# Patient Record
Sex: Female | Born: 1990 | Race: Black or African American | Hispanic: No | Marital: Single | Smoking: Never smoker
Health system: Southern US, Community
[De-identification: ages and names within clinical notes are randomized; demographics above are authoritative.]

---

## 2014-10-26 ENCOUNTER — Ambulatory Visit: Payer: Self-pay | Admitting: Family Medicine

## 2014-12-12 ENCOUNTER — Emergency Department (HOSPITAL_COMMUNITY): Payer: Medicaid Other

## 2014-12-12 ENCOUNTER — Emergency Department (HOSPITAL_COMMUNITY)
Admission: EM | Admit: 2014-12-12 | Discharge: 2014-12-12 | Disposition: A | Discharge status: Home / Self Care | Payer: Medicaid Other | Attending: Emergency Medicine | Admitting: Emergency Medicine

## 2014-12-12 ENCOUNTER — Encounter (HOSPITAL_COMMUNITY): Payer: Self-pay | Admitting: *Deleted

## 2014-12-12 DIAGNOSIS — R0789 Other chest pain: Secondary | ICD-10-CM | POA: Insufficient documentation

## 2014-12-12 DIAGNOSIS — R05 Cough: Secondary | ICD-10-CM | POA: Insufficient documentation

## 2014-12-12 DIAGNOSIS — R079 Chest pain, unspecified: Secondary | ICD-10-CM | POA: Diagnosis present

## 2014-12-12 NOTE — ED Notes (Signed)
PA at bedside.

## 2014-12-12 NOTE — ED Notes (Signed)
Pt reports sharp mid chest pains since last night, having non productive cough, pain increases when taking a deep breath.

## 2014-12-12 NOTE — ED Provider Notes (Signed)
CSN: [id number]     Arrival date & time 12/12/14  1146 History   First MD Initiated Contact with Patient 12/12/14 1158     Chief Complaint  Patient presents with  . Chest Pain    HPI   24 year old female presents with right anterior chest wall pain. SHE REPORTS SHE WAS CLEANING AROUND THE HOUSE WHEN SHE FELT A SHARP PAIN TO HER RIGHT ANTERIOR CHEST, WORSE WITH DEEP INSPIRATIONS. The pain is only present with palpation and respirations, no radiation of pain. Patient denies shortness of breath, diaphoresis, fever, chills, nausea or vomiting, distal extremity swelling, rhinorrhea, sore throat. She does endorse a nonproductive cough times one day.  Patient reports that she does not smoke, use recreational drugs, no recent history of immobilization, surgery, and is not sexually active. She otherwise healthy young female with no additional concerns in addition to the anterior chest pain.   History reviewed. No pertinent past medical history. History reviewed. No pertinent past surgical history. History reviewed. No pertinent family history. History  Substance Use Topics  . Smoking status: Not on file  . Smokeless tobacco: Not on file  . Alcohol Use: No   OB History    No data available     Review of Systems  All other systems reviewed and are negative.  Allergies  Review of patient's allergies indicates no known allergies.  Home Medications   Prior to Admission medications   Not on File   BP 108/64 mmHg  Pulse 78  Temp(Src) 98 F (36.7 C) (Oral)  Resp 18  Ht 5' (1.524 m)  Wt 135 lb (61.236 kg)  BMI 26.37 kg/m2  SpO2 100%  LMP 11/17/2014 Physical Exam  Constitutional: She is oriented to person, place, and time. She appears well-developed and well-nourished.  HENT:  Head: Normocephalic and atraumatic.  Eyes: Pupils are equal, round, and reactive to light.  Neck: Normal range of motion. Neck supple. No JVD present. No tracheal deviation present. No thyromegaly present.   Cardiovascular: Normal rate, regular rhythm, normal heart sounds and intact distal pulses.  Exam reveals no gallop and no friction rub.   No murmur heard. Pulmonary/Chest: Effort normal and breath sounds normal. No stridor. No respiratory distress. She has no decreased breath sounds. She has no wheezes. She has no rales. She exhibits no tenderness.  Tenderness to palpation of right anterior chest wall no signs of trauma.  Musculoskeletal: Normal range of motion.  Lymphadenopathy:    She has no cervical adenopathy.  Neurological: She is alert and oriented to person, place, and time. Coordination normal.  Skin: Skin is warm and dry.  Psychiatric: She has a normal mood and affect. Her behavior is normal. Judgment and thought content normal.  Nursing note and vitals reviewed.   ED Course  Procedures (including critical care time) Labs Review Labs Reviewed - No data to display  Imaging Review No results found.   EKG Interpretation   Date/Time:  Sunday December 12 2014 11:51:37 EDT Ventricular Rate:  76 PR Interval:  126 QRS Duration: 84 QT Interval:  374 QTC Calculation: 420 R Axis:   80 Text Interpretation:  Normal sinus rhythm with sinus arrhythmia Normal ECG  Sinus rhythm Artifact Abnormal ekg Confirmed by Gerhard MunchLOCKWOOD, ROBERT  MD  [phone]([id number]) on 12/12/2014 12:15:31 PM      MDM   Final diagnoses:  Chest wall pain    Labs: None  Imaging: DG chest 2 view  Consults: None  Therapeutics: None  Assessment: chest wall pain  Plan: She is presentation likely represents musculoskeletal/cartilage pain from her activities yesterday. Highly unlikely this is due to PE, pneumonia, or cardiac in nature due to patient's presentation and low risk profile. Patient is instructed to use ibuprofen as needed for the pain, avoid aggravating activities, and follow up with her primary care provider if symptoms worsen or do not improve. She understood and agreed to this plan. No concerns at time of  discharge.      Eyvonne Mechanic, PA-C 12/12/14 1428  Gerhard Munch, MD 12/12/14 [phone]

## 2014-12-12 NOTE — Discharge Instructions (Signed)
Chest Pain (Nonspecific) °It is often hard to give a specific diagnosis for the cause of chest pain. There is always a chance that your pain could be related to something serious, such as a heart attack or a blood clot in the lungs. You need to follow up with your health care provider for further evaluation. °CAUSES  °· Heartburn. °· Pneumonia or bronchitis. °· Anxiety or stress. °· Inflammation around your heart (pericarditis) or lung (pleuritis or pleurisy). °· A blood clot in the lung. °· A collapsed lung (pneumothorax). It can develop suddenly on its own (spontaneous pneumothorax) or from trauma to the chest. °· Shingles infection (herpes zoster virus). °The chest wall is composed of bones, muscles, and cartilage. Any of these can be the source of the pain. °· The bones can be bruised by injury. °· The muscles or cartilage can be strained by coughing or overwork. °· The cartilage can be affected by inflammation and become sore (costochondritis). °DIAGNOSIS  °Lab tests or other studies may be needed to find the cause of your pain. Your health care provider may have you take a test called an ambulatory electrocardiogram (ECG). An ECG records your heartbeat patterns over a 24-hour period. You may also have other tests, such as: °· Transthoracic echocardiogram (TTE). During echocardiography, sound waves are used to evaluate how blood flows through your heart. °· Transesophageal echocardiogram (TEE). °· Cardiac monitoring. This allows your health care provider to monitor your heart rate and rhythm in real time. °· Holter monitor. This is a portable device that records your heartbeat and can help diagnose heart arrhythmias. It allows your health care provider to track your heart activity for several days, if needed. °· Stress tests by exercise or by giving medicine that makes the heart beat faster. °TREATMENT  °· Treatment depends on what may be causing your chest pain. Treatment may include: °¨ Acid blockers for  heartburn. °¨ Anti-inflammatory medicine. °¨ Pain medicine for inflammatory conditions. °¨ Antibiotics if an infection is present. °· You may be advised to change lifestyle habits. This includes stopping smoking and avoiding alcohol, caffeine, and chocolate. °· You may be advised to keep your head raised (elevated) when sleeping. This reduces the chance of acid going backward from your stomach into your esophagus. °Most of the time, nonspecific chest pain will improve within 2-3 days with rest and mild pain medicine.  °HOME CARE INSTRUCTIONS  °· If antibiotics were prescribed, take them as directed. Finish them even if you start to feel better. °· For the next few days, avoid physical activities that bring on chest pain. Continue physical activities as directed. °· Do not use any tobacco products, including cigarettes, chewing tobacco, or electronic cigarettes. °· Avoid drinking alcohol. °· Only take medicine as directed by your health care provider. °· Follow your health care provider's suggestions for further testing if your chest pain does not go away. °· Keep any follow-up appointments you made. If you do not go to an appointment, you could develop lasting (chronic) problems with pain. If there is any problem keeping an appointment, call to reschedule. °SEEK MEDICAL CARE IF:  °· Your chest pain does not go away, even after treatment. °· You have a rash with blisters on your chest. °· You have a fever. °SEEK IMMEDIATE MEDICAL CARE IF:  °· You have increased chest pain or pain that spreads to your arm, neck, jaw, back, or abdomen. °· You have shortness of breath. °· You have an increasing cough, or you cough   up blood.  You have severe back or abdominal pain.  You feel nauseous or vomit.  You have severe weakness.  You faint.  You have chills. This is an emergency. Do not wait to see if the pain will go away. Get medical help at once. Call your local emergency services (911 in [place] Do not drive  yourself to the hospital. MAKE SURE YOU:   Understand these instructions.  Will watch your condition.  Will get help right away if you are not doing well or get worse. Document Released: 05/30/2005 Document Revised: 08/25/2013 Document Reviewed: 03/25/2008 [facility] Patient Information 2015 [employer] [place]. This information is not intended to replace advice given to you by your health care provider. Make sure you discuss any questions you have with your health care provider.  Please use ibuprofen 400 mg every 6 hours as needed for pain. If symptoms do not improve or worsen please follow-up with your primary care provider further evaluation and management. If worrisome symptoms present please return for further evaluation and management

## 2014-12-12 NOTE — ED Notes (Signed)
Patient transported to X-ray 

## 2015-05-02 ENCOUNTER — Emergency Department (INDEPENDENT_AMBULATORY_CARE_PROVIDER_SITE_OTHER)
Admission: EM | Admit: 2015-05-02 | Discharge: 2015-05-02 | Disposition: A | Discharge status: Home / Self Care | Payer: Medicaid Other | Source: Home / Self Care | Attending: Family Medicine | Admitting: Family Medicine

## 2015-05-02 ENCOUNTER — Encounter (HOSPITAL_COMMUNITY): Payer: Self-pay | Admitting: Emergency Medicine

## 2015-05-02 DIAGNOSIS — N926 Irregular menstruation, unspecified: Secondary | ICD-10-CM | POA: Diagnosis not present

## 2015-05-02 DIAGNOSIS — Z3009 Encounter for other general counseling and advice on contraception: Secondary | ICD-10-CM | POA: Diagnosis not present

## 2015-05-02 LAB — POCT URINALYSIS DIP (DEVICE)
BILIRUBIN URINE: NEGATIVE
Glucose, UA: NEGATIVE mg/dL
HGB URINE DIPSTICK: NEGATIVE
KETONES UR: NEGATIVE mg/dL
LEUKOCYTES UA: NEGATIVE
NITRITE: NEGATIVE
PROTEIN: NEGATIVE mg/dL
Urobilinogen, UA: 0.2 mg/dL (ref 0.0–1.0)
pH: 6 (ref 5.0–8.0)

## 2015-05-02 LAB — POCT PREGNANCY, URINE: PREG TEST UR: NEGATIVE

## 2015-05-02 NOTE — ED Provider Notes (Signed)
CSN: [id number]     Arrival date & time 05/02/15  1747 History   First MD Initiated Contact with Patient 05/02/15 1851     Chief Complaint  Patient presents with  . Vaginal Bleeding   (Consider location/radiation/quality/duration/timing/severity/associated sxs/prior Treatment) HPI    Vaginal bleeding: Patients menstrual cycles are typically every 28 days and last approximately 4 days. Patient states that she started vaginal bleeding 7 days early and bled for 5 days. She had 1-2 days without any bleeding but then started another 3 days of bleeding. She came in after she stopped bleeding. Patient is sexually active without any form of birth control. Patient is not trying to get pregnant. Patient denies any other vaginal discharge, vaginal irritation, dysuria, frequency, back pain, fevers, lower abdominal pain, back pain, nausea, vomiting, diarrhea, constipation.   History reviewed. No pertinent past medical history. History reviewed. No pertinent past surgical history. History reviewed. No pertinent family history. Social History  Substance Use Topics  . Smoking status: None  . Smokeless tobacco: None  . Alcohol Use: No   OB History    No data available     Review of Systems Per HPI with all other pertinent systems negative.   Allergies  Review of patient's allergies indicates no known allergies.  Home Medications   Prior to Admission medications   Medication Sig Start Date End Date Taking? Authorizing Provider  aspirin-acetaminophen-caffeine (EXCEDRIN MIGRAINE) [phone] MG per tablet Take 1 tablet by mouth every 6 (six) hours as needed for headache.    Historical Provider, MD  Echinacea-Goldenseal (IMMUNE HEALTH BLEND) CAPS Take 1 capsule by mouth daily.    Historical Provider, MD   Meds Ordered and Administered this Visit  Medications - No data to display  BP 113/53 mmHg  Pulse 64  Temp(Src) 98.3 F (36.8 C) (Oral)  Resp 16  SpO2 100%  LMP 05/01/2015 No data  found.   Physical Exam Physical Exam  Constitutional: oriented to person, place, and time. appears well-developed and well-nourished. No distress.  HENT:  Head: Normocephalic and atraumatic.  Eyes: EOMI. PERRL.  Neck: Normal range of motion.  Cardiovascular: RRR, no m/r/g, 2+ distal pulses,  Pulmonary/Chest: Effort normal and breath sounds normal. No respiratory distress.  Abdominal: Soft. Bowel sounds are normal. NonTTP, no distension.  Musculoskeletal: Normal range of motion. Non ttp, no effusion.  Neurological: alert and oriented to person, place, and time.  Skin: Skin is warm. No rash noted. non diaphoretic.  Psychiatric: normal mood and affect. behavior is normal. Judgment and thought content normal.   ED Course  Procedures (including critical care time)  Labs Review Labs Reviewed  POCT URINALYSIS DIP (DEVICE)  POCT PREGNANCY, URINE    Imaging Review No results found.   Visual Acuity Review  Right Eye Distance:   Left Eye Distance:   Bilateral Distance:    Right Eye Near:   Left Eye Near:    Bilateral Near:         MDM   1. Abnormal menstrual cycle   2. General counselling and advice on contraception    UA negative. Urine pregnancy negative. Patient likely with normal physiologic change in hormone levels causing abnormal uterine bleeding. No evidence of pregnancy at this time. Significant counseling given to patient regarding birth control and patient will seek follow-up with her primary care physician regarding further birth control options. Counseled patient that if this problem persists she may need to seek further workup such as testing appointment levels, dosing of Provera, or  endometrial testing or ultrasound. Patient will follow-up as needed.   Ozella Rocks, MD 05/02/15 [phone]

## 2015-05-02 NOTE — Discharge Instructions (Signed)
The cause of your symptoms is likely abnormal hormone levels. This is to be expected from time to time. Your pregnancy test was negative. Please call your primary care physician's office tomorrow to schedule a follow-up for sometime in the next 1-4 weeks to discuss birth control and further management if this problem persists. There is no sign of other serious underlying medical condition.

## 2015-05-02 NOTE — ED Notes (Signed)
C/o vaginal bleeding

## 2015-11-24 ENCOUNTER — Encounter (HOSPITAL_COMMUNITY): Payer: Self-pay | Admitting: Emergency Medicine

## 2015-11-24 ENCOUNTER — Emergency Department (HOSPITAL_COMMUNITY)
Admission: EM | Admit: 2015-11-24 | Discharge: 2015-11-24 | Disposition: A | Discharge status: Home / Self Care | Payer: Medicaid Other | Attending: Emergency Medicine | Admitting: Emergency Medicine

## 2015-11-24 ENCOUNTER — Emergency Department (HOSPITAL_COMMUNITY): Payer: Medicaid Other

## 2015-11-24 DIAGNOSIS — E860 Dehydration: Secondary | ICD-10-CM | POA: Diagnosis not present

## 2015-11-24 DIAGNOSIS — R000 Tachycardia, unspecified: Secondary | ICD-10-CM | POA: Insufficient documentation

## 2015-11-24 DIAGNOSIS — R69 Illness, unspecified: Secondary | ICD-10-CM

## 2015-11-24 DIAGNOSIS — J111 Influenza due to unidentified influenza virus with other respiratory manifestations: Secondary | ICD-10-CM | POA: Insufficient documentation

## 2015-11-24 DIAGNOSIS — R05 Cough: Secondary | ICD-10-CM | POA: Diagnosis present

## 2015-11-24 LAB — URINALYSIS, ROUTINE W REFLEX MICROSCOPIC
Bilirubin Urine: NEGATIVE
GLUCOSE, UA: NEGATIVE mg/dL
Ketones, ur: NEGATIVE mg/dL
LEUKOCYTES UA: NEGATIVE
Nitrite: NEGATIVE
PROTEIN: NEGATIVE mg/dL
Specific Gravity, Urine: 1.005 (ref 1.005–1.030)
pH: 6 (ref 5.0–8.0)

## 2015-11-24 LAB — COMPREHENSIVE METABOLIC PANEL
ALT: 20 U/L (ref 14–54)
AST: 22 U/L (ref 15–41)
Albumin: 4.3 g/dL (ref 3.5–5.0)
Alkaline Phosphatase: 58 U/L (ref 38–126)
Anion gap: 8 (ref 5–15)
BILIRUBIN TOTAL: 0.3 mg/dL (ref 0.3–1.2)
BUN: 9 mg/dL (ref 6–20)
CHLORIDE: 103 mmol/L (ref 101–111)
CO2: 26 mmol/L (ref 22–32)
Calcium: 9 mg/dL (ref 8.9–10.3)
Creatinine, Ser: 0.83 mg/dL (ref 0.44–1.00)
GFR calc Af Amer: >60 mL/min (ref >60)
GFR calc non Af Amer: >60 mL/min (ref >60)
Glucose, Bld: 87 mg/dL (ref 65–99)
POTASSIUM: 3.8 mmol/L (ref 3.5–5.1)
Sodium: 137 mmol/L (ref 135–145)
Total Protein: 8 g/dL (ref 6.5–8.1)

## 2015-11-24 LAB — CBC
HEMATOCRIT: 40.8 % (ref 36.0–46.0)
Hemoglobin: 13.2 g/dL (ref 12.0–15.0)
MCH: 28.6 pg (ref 26.0–34.0)
MCHC: 32.4 g/dL (ref 30.0–36.0)
MCV: 88.3 fL (ref 78.0–100.0)
PLATELETS: 263 10*3/uL (ref 150–400)
RBC: 4.62 MIL/uL (ref 3.87–5.11)
RDW: 12.5 % (ref 11.5–15.5)
WBC: 6.1 10*3/uL (ref 4.0–10.5)

## 2015-11-24 LAB — URINE MICROSCOPIC-ADD ON

## 2015-11-24 LAB — LIPASE, BLOOD: LIPASE: 31 U/L (ref 11–51)

## 2015-11-24 MED ORDER — ONDANSETRON 8 MG PO TBDP: 8.0000 mg | ORAL_TABLET | Freq: Three times a day (TID) | ORAL | Status: DC | PRN

## 2015-11-24 MED ORDER — ACETAMINOPHEN 325 MG PO TABS
650.0000 mg | ORAL_TABLET | Freq: Once | ORAL | Status: AC
  Administered 2015-11-24: 650 mg via ORAL
  Filled 2015-11-24: qty 2

## 2015-11-24 MED ORDER — SODIUM CHLORIDE 0.9 % IV BOLUS (SEPSIS)
1000.0000 mL | Freq: Once | INTRAVENOUS | Status: AC
  Administered 2015-11-24: 1000 mL via INTRAVENOUS

## 2015-11-24 MED ORDER — KETOROLAC TROMETHAMINE 30 MG/ML IJ SOLN
30.0000 mg | Freq: Once | INTRAMUSCULAR | Status: AC
Start: 2015-11-24 — End: 2015-11-24
  Administered 2015-11-24: 30 mg via INTRAVENOUS
  Filled 2015-11-24: qty 1

## 2015-11-24 MED ORDER — ONDANSETRON HCL 4 MG/2ML IJ SOLN
4.0000 mg | Freq: Once | INTRAMUSCULAR | Status: AC
  Administered 2015-11-24: 4 mg via INTRAVENOUS
  Filled 2015-11-24: qty 2

## 2015-11-24 MED ORDER — ONDANSETRON 8 MG PO TBDP: 8.0000 mg | ORAL_TABLET | Freq: Once | ORAL | Status: DC

## 2015-11-24 NOTE — Progress Notes (Signed)
Pt is covered by Medicaid family planning

## 2015-11-24 NOTE — ED Provider Notes (Signed)
CSN: [id number]     Arrival date & time 11/24/15  [id number] History   First MD Initiated Contact with Patient 11/24/15 1204     Chief Complaint  Patient presents with  . Cough  . Generalized Body Aches  . Sore Throat     HPI Patient presents to the emergency department with complaints of cough, body aches, sore throat and fever past several days. She reports no past medical history. She denies diarrhea. She reports mild decreased oral intake. She reports feeling generally weak. Symptoms are mild-to-moderate in severity. Mother worsens or improves her symptoms.   History reviewed. No pertinent past medical history. No past surgical history on file. No family history on file. Social History  Substance Use Topics  . Smoking status: None  . Smokeless tobacco: None  . Alcohol Use: No   OB History    No data available     Review of Systems  All other systems reviewed and are negative.     Allergies  Review of patient's allergies indicates no known allergies.  Home Medications   Prior to Admission medications   Medication Sig Start Date End Date Taking? Authorizing Provider  aspirin-acetaminophen-caffeine (EXCEDRIN MIGRAINE) [phone] MG per tablet Take 1 tablet by mouth every 6 (six) hours as needed for headache.   Yes Historical Provider, MD   BP 122/75 mmHg  Pulse 105  Temp(Src) 100.3 F (37.9 C) (Oral)  Resp 18  SpO2 100%  LMP 11/24/2015 Physical Exam  Constitutional: She is oriented to person, place, and time. She appears well-developed and well-nourished. No distress.  HENT:  Head: Normocephalic and atraumatic.  Dry mucous membranes  Eyes: EOM are normal.  Neck: Normal range of motion.  Cardiovascular: Regular rhythm and normal heart sounds.   Tachycardia  Pulmonary/Chest: Effort normal and breath sounds normal.  Abdominal: Soft. She exhibits no distension. There is no tenderness.  Musculoskeletal: Normal range of motion.  Neurological: She is alert and  oriented to person, place, and time.  Skin: Skin is warm and dry.  Psychiatric: She has a normal mood and affect. Judgment normal.  Nursing note and vitals reviewed.   ED Course  Procedures (including critical care time) Labs Review Labs Reviewed  URINALYSIS, ROUTINE W REFLEX MICROSCOPIC (NOT AT [facility]) - Abnormal; Notable for the following:    Hgb urine dipstick LARGE (*)    All other components within normal limits  URINE MICROSCOPIC-ADD ON - Abnormal; Notable for the following:    Squamous Epithelial / LPF 0-5 (*)    Bacteria, UA RARE (*)    All other components within normal limits  LIPASE, BLOOD  COMPREHENSIVE METABOLIC PANEL  CBC    Imaging Review Dg Chest 2 View  11/24/2015  CLINICAL DATA:  Cough, fever, headache for about 3 days EXAM: CHEST  2 VIEW COMPARISON:  None. FINDINGS: Cardiomediastinal silhouette is unremarkable. No acute infiltrate or pleural effusion. No pulmonary edema. Mild perihilar bronchitic changes. Bony thorax is unremarkable. IMPRESSION: No infiltrate or pulmonary edema. Mild perihilar bronchitic changes. Electronically Signed   By: Natasha MeadLiviu  Pop M.D.   On: 11/24/2015 12:43   I have personally reviewed and evaluated these images and lab results as part of my medical decision-making.   EKG Interpretation None      MDM   Final diagnoses:  None    Overall well-appearing. Feels much better after IV fluids. Nontoxic. Discharge home with influenza-like illness and dehydration. Home with antinausea medicine and instructions to continue oral hydration at home. Ibuprofen  and Tylenol for symptom control.    Azalia Bilis, MD 11/24/15 [phone]

## 2015-11-24 NOTE — ED Notes (Signed)
Pt c/o cough, body aches, sore throat, and fever x 3 days.  Pt states she has also been vomiting.

## 2015-11-29 ENCOUNTER — Emergency Department (HOSPITAL_COMMUNITY)
Admission: EM | Admit: 2015-11-29 | Discharge: 2015-11-29 | Disposition: A | Discharge status: Home / Self Care | Payer: Self-pay | Attending: Emergency Medicine | Admitting: Emergency Medicine

## 2015-11-29 ENCOUNTER — Encounter (HOSPITAL_COMMUNITY): Payer: Self-pay | Admitting: Emergency Medicine

## 2015-11-29 ENCOUNTER — Emergency Department (HOSPITAL_COMMUNITY): Payer: Self-pay

## 2015-11-29 DIAGNOSIS — R0981 Nasal congestion: Secondary | ICD-10-CM

## 2015-11-29 DIAGNOSIS — J3489 Other specified disorders of nose and nasal sinuses: Secondary | ICD-10-CM | POA: Insufficient documentation

## 2015-11-29 DIAGNOSIS — Z79899 Other long term (current) drug therapy: Secondary | ICD-10-CM | POA: Insufficient documentation

## 2015-11-29 MED ORDER — IPRATROPIUM BROMIDE 0.03 % NA SOLN
2.0000 | NASAL | Status: AC
  Administered 2015-11-29: 2 via NASAL
  Filled 2015-11-29: qty 30

## 2015-11-29 NOTE — Discharge Instructions (Signed)
You have been given Atrovent nasal spray to help with your nasal congestion, uses 3 times a day.  Make sure to drink plenty of fluids.  Continue taking Tylenol, ibuprofen, alternating basis for fevers or myalgias

## 2015-11-29 NOTE — ED Provider Notes (Signed)
CSN: [id number]     Arrival date & time 11/29/15  0052 History   First MD Initiated Contact with Patient 11/29/15 0335     Chief Complaint  Patient presents with  . Shortness of Breath     (Consider location/radiation/quality/duration/timing/severity/associated sxs/prior Treatment) HPI Comments: This is a 25 year old female who was diagnosed with influenza-like illness on March 23.  Presenting tonight with nasal congestion that she states will not respond to any of the over-the-counter medication.  She has taken.  She is finding it hard to sleep, and expresses it as "shortness of breath."  She is not having any coughing.  She is able to get a full long of air  Patient is a 25 y.o. female presenting with shortness of breath. The history is provided by the patient.  Shortness of Breath Severity:  Moderate Onset quality:  Gradual Timing:  Constant Progression:  Unchanged Chronicity:  New Relieved by:  Nothing Worsened by:  Nothing tried Associated symptoms: no ear pain     History reviewed. No pertinent past medical history. History reviewed. No pertinent past surgical history. No family history on file. Social History  Substance Use Topics  . Smoking status: Never Smoker   . Smokeless tobacco: None  . Alcohol Use: No   OB History    No data available     Review of Systems  HENT: Positive for congestion and rhinorrhea. Negative for dental problem, ear pain, postnasal drip and sinus pressure.   Respiratory: Positive for shortness of breath.       Allergies  Review of patient's allergies indicates no known allergies.  Home Medications   Prior to Admission medications   Medication Sig Start Date End Date Taking? Authorizing Provider  aspirin-acetaminophen-caffeine (EXCEDRIN MIGRAINE) [phone] MG per tablet Take 1 tablet by mouth every 6 (six) hours as needed for headache.   Yes Historical Provider, MD  DM-Phenylephrine-Acetaminophen (ALKA-SELTZER PLS SINUS & COUGH)  10-5-325 MG CAPS Take 1 capsule by mouth every 6 (six) hours.   Yes Historical Provider, MD  guaiFENesin (MUCINEX) 600 MG 12 hr tablet Take 600 mg by mouth 2 (two) times daily.   Yes Historical Provider, MD  ibuprofen (ADVIL,MOTRIN) 200 MG tablet Take 400 mg by mouth every 6 (six) hours as needed for moderate pain.   Yes Historical Provider, MD  Pseudoeph-Doxylamine-DM-APAP (NYQUIL MULTI-SYMPTOM PO) Take 30 mLs by mouth at bedtime.   Yes Historical Provider, MD  ondansetron (ZOFRAN ODT) 8 MG disintegrating tablet Take 1 tablet (8 mg total) by mouth every 8 (eight) hours as needed for nausea or vomiting. Patient not taking: Reported on 11/29/2015 11/24/15   Azalia BilisKevin Campos, MD   BP 103/77 mmHg  Pulse 77  Temp(Src) 97.8 F (36.6 C) (Oral)  Resp 20  SpO2 100%  LMP 11/24/2015 Physical Exam  Constitutional: She appears well-developed and well-nourished.  HENT:  Head: Normocephalic.  Nose: Rhinorrhea present. No sinus tenderness. Right sinus exhibits no maxillary sinus tenderness and no frontal sinus tenderness. Left sinus exhibits no maxillary sinus tenderness and no frontal sinus tenderness.  Mouth/Throat: Oropharynx is clear and moist.  Eyes: Pupils are equal, round, and reactive to light.  Neck: Normal range of motion.  Cardiovascular: Normal rate.   Pulmonary/Chest: Effort normal.  Musculoskeletal: Normal range of motion.  Neurological: She is alert.  Skin: Skin is warm.  Nursing note and vitals reviewed.   ED Course  Procedures (including critical care time) Labs Review Labs Reviewed - No data to display  Imaging Review Dg  Chest 2 View  11/29/2015  CLINICAL DATA:  Acute onset of shortness of breath. Productive cough. Initial encounter. EXAM: CHEST  2 VIEW COMPARISON:  Chest radiograph performed 11/24/2015 FINDINGS: The lungs are well-aerated and clear. There is no evidence of focal opacification, pleural effusion or pneumothorax. The heart is normal in size; the mediastinal contour is  within normal limits. No acute osseous abnormalities are seen. IMPRESSION: No acute cardiopulmonary process seen. Electronically Signed   By: Roanna Raider M.D.   On: 11/29/2015 02:05   I have personally reviewed and evaluated these images and lab results as part of my medical decision-making.   EKG Interpretation None     Initially, patient was sleeping when I entered the room, in no respiratory distress.  She's been given Atrovent nasal spray to help with her nasal congestion. MDM   Final diagnoses:  Nasal congestion         Earley Favor, NP 11/29/15 1610  Tomasita Crumble, MD 11/29/15 [phone]

## 2015-11-29 NOTE — ED Notes (Signed)
Pt c/o shortness of breath x 4 days, worse at night when she attempts to lay down, productive cough with yellow sputum.

## 2016-08-27 IMAGING — CR DG CHEST 2V
2 series · 2 of 2 positions shown · non-contrast
Comparison: Chest radiograph performed 11/24/2015

CLINICAL DATA: Acute onset of shortness of breath. Productive
cough. Initial encounter.

EXAM:
CHEST  2 VIEW

[w chest pa]
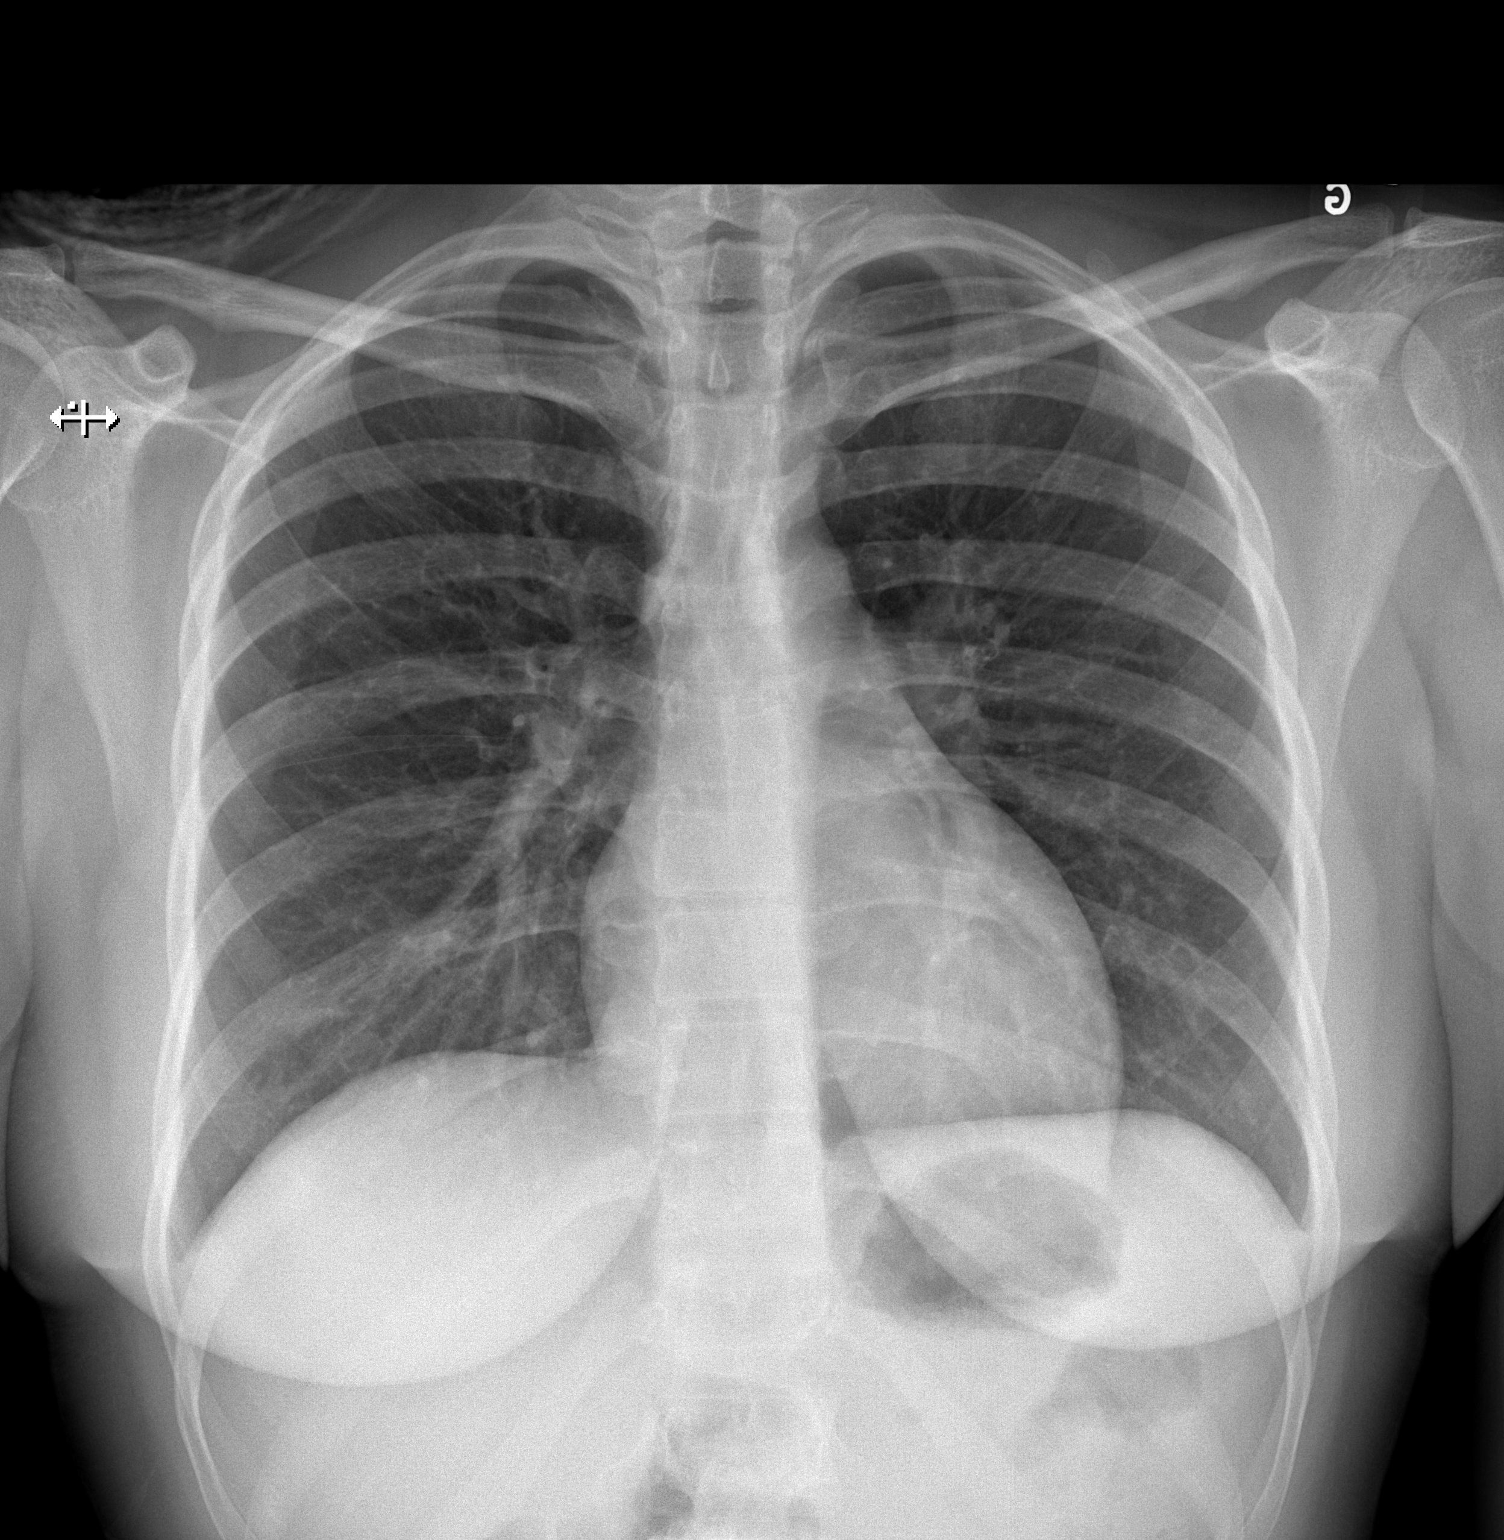

[w chest lat]
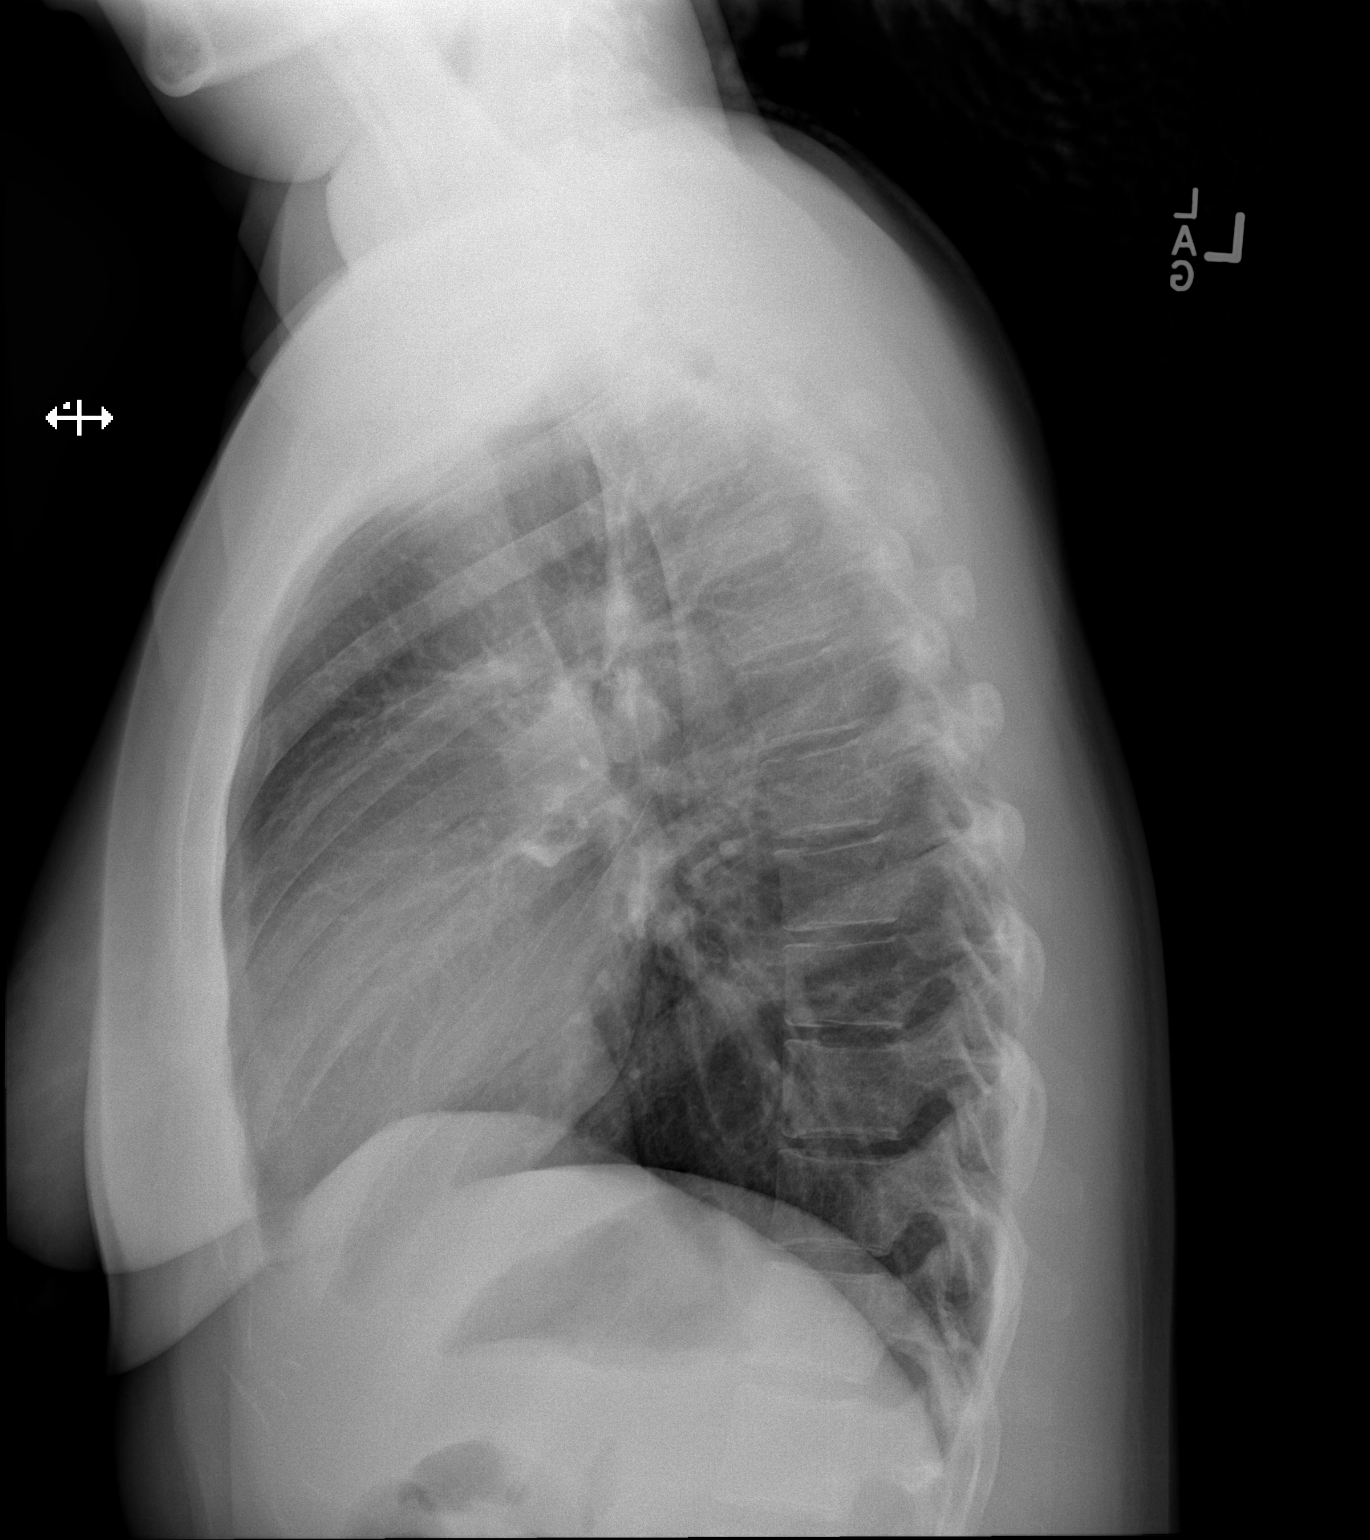

[2 of 2 positions shown; findings below may reference images not displayed]

FINDINGS: The lungs are well-aerated and clear. There is no evidence of focal
opacification, pleural effusion or pneumothorax.

The heart is normal in size; the mediastinal contour is within
normal limits. No acute osseous abnormalities are seen.
IMPRESSION: No acute cardiopulmonary process seen.

## 2017-09-03 NOTE — L&D Delivery Note (Addendum)
Patient: Kirsten Esparza MRN: [id number]  GBS status: Positive, IAP given (Amp)   Patient is a 27 y.o. now G5P5005 s/p NSVD at [redacted]w[redacted]d, who was admitted for SOL. AROM 0h 61m prior to delivery with clear fluid.    Delivery Note At 12:06 PM a viable female was delivered via Vaginal, Spontaneous (Presentation: Right OP).  APGAR: 9, 9; weight  .   Placenta status: Spontaneous, intact. Cord: 3 vessel, intact, longer than normally expected with the following complications: Nuchal cord x1.   Anesthesia: Epidural  Episiotomy: None Lacerations: 1st degree;Perineal Suture Repair: N/A Est. Blood Loss (mL): 208  Head delivered Right OA. Nuchal cord x1. Shoulder and body delivered in usual fashion. Infant with spontaneous cry, placed on mother's abdomen, dried and bulb suctioned. Cord clamped x 2 after 1-minute delay, and cut by mom. Cord blood drawn. Placenta delivered spontaneously with gentle cord traction. Fundus firm with massage and Pitocin. Perineum inspected and found to have a 1st degree perineal laceration, which was found to be hemostatic.  Mom to postpartum.  Baby to Couplet care / Skin to Skin.  Allayne Stack 06/23/2018, 12:27 PM  I confirm that I have verified the information documented in the resident's note and that I have also personally reperformed the physical exam and all medical decision making activities.  I was gloved and present for entire delivery SVD without incident No difficulty with shoulders Lacerations as listed above Repair not indicated  Aviva Signs, CNM

## 2018-01-28 DIAGNOSIS — Z3482 Encounter for supervision of other normal pregnancy, second trimester: Secondary | ICD-10-CM | POA: Diagnosis not present

## 2018-01-28 LAB — OB RESULTS CONSOLE GBS: GBS: POSITIVE

## 2018-03-10 DIAGNOSIS — O358XX0 Maternal care for other (suspected) fetal abnormality and damage, not applicable or unspecified: Secondary | ICD-10-CM | POA: Diagnosis not present

## 2018-03-17 DIAGNOSIS — O358XX0 Maternal care for other (suspected) fetal abnormality and damage, not applicable or unspecified: Secondary | ICD-10-CM | POA: Diagnosis not present

## 2018-03-17 DIAGNOSIS — O365930 Maternal care for other known or suspected poor fetal growth, third trimester, not applicable or unspecified: Secondary | ICD-10-CM | POA: Diagnosis not present

## 2018-05-22 DIAGNOSIS — Z1388 Encounter for screening for disorder due to exposure to contaminants: Secondary | ICD-10-CM | POA: Diagnosis not present

## 2018-05-22 DIAGNOSIS — Z3009 Encounter for other general counseling and advice on contraception: Secondary | ICD-10-CM | POA: Diagnosis not present

## 2018-05-22 DIAGNOSIS — Z23 Encounter for immunization: Secondary | ICD-10-CM | POA: Diagnosis not present

## 2018-05-22 DIAGNOSIS — F53 Postpartum depression: Secondary | ICD-10-CM | POA: Diagnosis not present

## 2018-05-22 DIAGNOSIS — N390 Urinary tract infection, site not specified: Secondary | ICD-10-CM | POA: Diagnosis not present

## 2018-05-22 DIAGNOSIS — Z3483 Encounter for supervision of other normal pregnancy, third trimester: Secondary | ICD-10-CM | POA: Diagnosis not present

## 2018-05-22 DIAGNOSIS — Z0389 Encounter for observation for other suspected diseases and conditions ruled out: Secondary | ICD-10-CM | POA: Diagnosis not present

## 2018-05-22 DIAGNOSIS — Z789 Other specified health status: Secondary | ICD-10-CM | POA: Diagnosis not present

## 2018-06-16 DIAGNOSIS — Z3483 Encounter for supervision of other normal pregnancy, third trimester: Secondary | ICD-10-CM | POA: Diagnosis not present

## 2018-06-23 ENCOUNTER — Inpatient Hospital Stay (HOSPITAL_COMMUNITY): Payer: Medicaid Other | Admitting: Anesthesiology

## 2018-06-23 ENCOUNTER — Other Ambulatory Visit: Payer: Self-pay

## 2018-06-23 ENCOUNTER — Inpatient Hospital Stay (HOSPITAL_COMMUNITY)
Admission: AD | Admit: 2018-06-23 | Discharge: 2018-06-25 | DRG: 807 | Disposition: A | Discharge status: Home / Self Care | Payer: Medicaid Other | Attending: Obstetrics and Gynecology | Admitting: Obstetrics and Gynecology

## 2018-06-23 ENCOUNTER — Encounter (HOSPITAL_COMMUNITY): Payer: Self-pay | Admitting: *Deleted

## 2018-06-23 DIAGNOSIS — Z3A38 38 weeks gestation of pregnancy: Secondary | ICD-10-CM | POA: Diagnosis not present

## 2018-06-23 DIAGNOSIS — O99824 Streptococcus B carrier state complicating childbirth: Secondary | ICD-10-CM | POA: Diagnosis present

## 2018-06-23 DIAGNOSIS — Z3483 Encounter for supervision of other normal pregnancy, third trimester: Secondary | ICD-10-CM | POA: Diagnosis not present

## 2018-06-23 LAB — ABO/RH: ABO/RH(D): O NEG

## 2018-06-23 LAB — CBC
HCT: 40 % (ref 36.0–46.0)
HEMOGLOBIN: 13.8 g/dL (ref 12.0–15.0)
MCH: 29.1 pg (ref 26.0–34.0)
MCHC: 34.5 g/dL (ref 30.0–36.0)
MCV: 84.2 fL (ref 80.0–100.0)
NRBC: 0 % (ref 0.0–0.2)
PLATELETS: 182 10*3/uL (ref 150–400)
RBC: 4.75 MIL/uL (ref 3.87–5.11)
RDW: 13.5 % (ref 11.5–15.5)
WBC: 10 10*3/uL (ref 4.0–10.5)

## 2018-06-23 LAB — TYPE AND SCREEN
ABO/RH(D): O NEG
ANTIBODY SCREEN: NEGATIVE
WEAK D: POSITIVE

## 2018-06-23 MED ORDER — OXYTOCIN 40 UNITS IN LACTATED RINGERS INFUSION - SIMPLE MED: 2.5000 [IU]/h | INTRAVENOUS | Status: DC

## 2018-06-23 MED ORDER — PHENYLEPHRINE 40 MCG/ML (10ML) SYRINGE FOR IV PUSH (FOR BLOOD PRESSURE SUPPORT)
80.0000 ug | PREFILLED_SYRINGE | INTRAVENOUS | Status: DC | PRN
  Filled 2018-06-23: qty 5
  Filled 2018-06-23: qty 10

## 2018-06-23 MED ORDER — ERYTHROMYCIN 5 MG/GM OP OINT
TOPICAL_OINTMENT | OPHTHALMIC | Status: AC
  Filled 2018-06-23: qty 1

## 2018-06-23 MED ORDER — WITCH HAZEL-GLYCERIN EX PADS: 1.0000 "application " | MEDICATED_PAD | CUTANEOUS | Status: DC | PRN

## 2018-06-23 MED ORDER — SODIUM CHLORIDE 0.9% FLUSH: 3.0000 mL | Freq: Two times a day (BID) | INTRAVENOUS | Status: DC

## 2018-06-23 MED ORDER — SOD CITRATE-CITRIC ACID 500-334 MG/5ML PO SOLN: 30.0000 mL | ORAL | Status: DC | PRN

## 2018-06-23 MED ORDER — PRENATAL MULTIVITAMIN CH
1.0000 | ORAL_TABLET | Freq: Every day | ORAL | Status: DC
  Administered 2018-06-24 – 2018-06-25 (×2): 1 via ORAL
  Filled 2018-06-23 (×2): qty 1

## 2018-06-23 MED ORDER — EPHEDRINE 5 MG/ML INJ
10.0000 mg | INTRAVENOUS | Status: DC | PRN
  Filled 2018-06-23: qty 2

## 2018-06-23 MED ORDER — LIDOCAINE HCL (PF) 1 % IJ SOLN
30.0000 mL | INTRAMUSCULAR | Status: DC | PRN
  Filled 2018-06-23: qty 30

## 2018-06-23 MED ORDER — OXYTOCIN BOLUS FROM INFUSION
500.0000 mL | Freq: Once | INTRAVENOUS | Status: AC
  Administered 2018-06-23: 500 mL via INTRAVENOUS

## 2018-06-23 MED ORDER — ACETAMINOPHEN 325 MG PO TABS
650.0000 mg | ORAL_TABLET | ORAL | Status: DC | PRN
  Administered 2018-06-23: 650 mg via ORAL
  Filled 2018-06-23: qty 2

## 2018-06-23 MED ORDER — PHENYLEPHRINE 40 MCG/ML (10ML) SYRINGE FOR IV PUSH (FOR BLOOD PRESSURE SUPPORT)
80.0000 ug | PREFILLED_SYRINGE | INTRAVENOUS | Status: DC | PRN
  Filled 2018-06-23: qty 5

## 2018-06-23 MED ORDER — OXYCODONE-ACETAMINOPHEN 5-325 MG PO TABS: 2.0000 | ORAL_TABLET | ORAL | Status: DC | PRN

## 2018-06-23 MED ORDER — OXYTOCIN 40 UNITS IN LACTATED RINGERS INFUSION - SIMPLE MED
INTRAVENOUS | Status: AC
  Administered 2018-06-23: 500 mL via INTRAVENOUS
  Filled 2018-06-23: qty 1000

## 2018-06-23 MED ORDER — ONDANSETRON HCL 4 MG/2ML IJ SOLN: 4.0000 mg | INTRAMUSCULAR | Status: DC | PRN

## 2018-06-23 MED ORDER — ACETAMINOPHEN 325 MG PO TABS: 650.0000 mg | ORAL_TABLET | ORAL | Status: DC | PRN

## 2018-06-23 MED ORDER — METHYLERGONOVINE MALEATE 0.2 MG PO TABS
0.2000 mg | ORAL_TABLET | ORAL | Status: AC | PRN
  Administered 2018-06-23 – 2018-06-24 (×5): 0.2 mg via ORAL
  Filled 2018-06-23 (×5): qty 1

## 2018-06-23 MED ORDER — LIDOCAINE HCL (PF) 1 % IJ SOLN
INTRAMUSCULAR | Status: DC | PRN
  Administered 2018-06-23: 10 mL via EPIDURAL

## 2018-06-23 MED ORDER — METHYLERGONOVINE MALEATE 0.2 MG/ML IJ SOLN
INTRAMUSCULAR | Status: AC
  Administered 2018-06-23: 0.2 mg
  Filled 2018-06-23: qty 1

## 2018-06-23 MED ORDER — TETANUS-DIPHTH-ACELL PERTUSSIS 5-2.5-18.5 LF-MCG/0.5 IM SUSP: 0.5000 mL | Freq: Once | INTRAMUSCULAR | Status: DC

## 2018-06-23 MED ORDER — SENNOSIDES-DOCUSATE SODIUM 8.6-50 MG PO TABS
2.0000 | ORAL_TABLET | ORAL | Status: DC
  Administered 2018-06-23 – 2018-06-24 (×2): 2 via ORAL
  Filled 2018-06-23 (×2): qty 2

## 2018-06-23 MED ORDER — DIPHENHYDRAMINE HCL 25 MG PO CAPS: 25.0000 mg | ORAL_CAPSULE | Freq: Four times a day (QID) | ORAL | Status: DC | PRN

## 2018-06-23 MED ORDER — MEASLES, MUMPS & RUBELLA VAC ~~LOC~~ INJ
0.5000 mL | INJECTION | Freq: Once | SUBCUTANEOUS | Status: DC
  Filled 2018-06-23: qty 0.5

## 2018-06-23 MED ORDER — SODIUM CHLORIDE 0.9 % IV SOLN
2.0000 g | Freq: Once | INTRAVENOUS | Status: AC
  Administered 2018-06-23: 2 g via INTRAVENOUS
  Filled 2018-06-23: qty 2

## 2018-06-23 MED ORDER — OXYCODONE-ACETAMINOPHEN 5-325 MG PO TABS: 1.0000 | ORAL_TABLET | ORAL | Status: DC | PRN

## 2018-06-23 MED ORDER — ZOLPIDEM TARTRATE 5 MG PO TABS: 5.0000 mg | ORAL_TABLET | Freq: Every evening | ORAL | Status: DC | PRN

## 2018-06-23 MED ORDER — SODIUM CHLORIDE 0.9 % IV SOLN: 250.0000 mL | INTRAVENOUS | Status: DC | PRN

## 2018-06-23 MED ORDER — IBUPROFEN 600 MG PO TABS
600.0000 mg | ORAL_TABLET | Freq: Four times a day (QID) | ORAL | Status: DC
  Administered 2018-06-23 – 2018-06-25 (×8): 600 mg via ORAL
  Filled 2018-06-23 (×8): qty 1

## 2018-06-23 MED ORDER — FENTANYL 2.5 MCG/ML BUPIVACAINE 1/10 % EPIDURAL INFUSION (WH - ANES)
14.0000 mL/h | INTRAMUSCULAR | Status: DC | PRN
  Administered 2018-06-23: 14 mL/h via EPIDURAL
  Filled 2018-06-23: qty 100

## 2018-06-23 MED ORDER — OXYTOCIN 10 UNIT/ML IJ SOLN
INTRAMUSCULAR | Status: AC
  Filled 2018-06-23: qty 1

## 2018-06-23 MED ORDER — FLEET ENEMA 7-19 GM/118ML RE ENEM: 1.0000 | ENEMA | RECTAL | Status: DC | PRN

## 2018-06-23 MED ORDER — ONDANSETRON HCL 4 MG PO TABS: 4.0000 mg | ORAL_TABLET | ORAL | Status: DC | PRN

## 2018-06-23 MED ORDER — LIDOCAINE HCL (PF) 1 % IJ SOLN
INTRAMUSCULAR | Status: AC
  Filled 2018-06-23: qty 30

## 2018-06-23 MED ORDER — ONDANSETRON HCL 4 MG/2ML IJ SOLN: 4.0000 mg | Freq: Four times a day (QID) | INTRAMUSCULAR | Status: DC | PRN

## 2018-06-23 MED ORDER — BENZOCAINE-MENTHOL 20-0.5 % EX AERO: 1.0000 "application " | INHALATION_SPRAY | CUTANEOUS | Status: DC | PRN

## 2018-06-23 MED ORDER — DIPHENHYDRAMINE HCL 50 MG/ML IJ SOLN: 12.5000 mg | INTRAMUSCULAR | Status: DC | PRN

## 2018-06-23 MED ORDER — SODIUM CHLORIDE 0.9% FLUSH: 3.0000 mL | INTRAVENOUS | Status: DC | PRN

## 2018-06-23 MED ORDER — LACTATED RINGERS IV SOLN
500.0000 mL | Freq: Once | INTRAVENOUS | Status: AC
  Administered 2018-06-23: 500 mL via INTRAVENOUS

## 2018-06-23 MED ORDER — LACTATED RINGERS IV SOLN
INTRAVENOUS | Status: DC
  Administered 2018-06-23: 10:00:00 via INTRAVENOUS

## 2018-06-23 MED ORDER — SIMETHICONE 80 MG PO CHEW: 80.0000 mg | CHEWABLE_TABLET | ORAL | Status: DC | PRN

## 2018-06-23 MED ORDER — DIBUCAINE 1 % RE OINT: 1.0000 "application " | TOPICAL_OINTMENT | RECTAL | Status: DC | PRN

## 2018-06-23 MED ORDER — LACTATED RINGERS IV SOLN: 500.0000 mL | INTRAVENOUS | Status: DC | PRN

## 2018-06-23 MED ORDER — COCONUT OIL OIL
1.0000 "application " | TOPICAL_OIL | Status: DC | PRN
  Filled 2018-06-23: qty 120

## 2018-06-23 NOTE — Anesthesia Postprocedure Evaluation (Signed)
Anesthesia Post Note  Patient: Kirsten Esparza  Procedure(s) Performed: AN AD HOC LABOR EPIDURAL     Patient location during evaluation: Mother Baby Anesthesia Type: Epidural Level of consciousness: awake and alert and oriented Pain management: satisfactory to patient Vital Signs Assessment: post-procedure vital signs reviewed and stable Respiratory status: respiratory function stable Cardiovascular status: stable Postop Assessment: no headache, no backache, epidural receding, patient able to bend at knees, no signs of nausea or vomiting and adequate PO intake Anesthetic complications: no    Last Vitals:  Vitals:   06/23/18 1424 06/23/18 1525  BP: 115/73 115/68  Pulse: 71 88  Resp: 16 15  Temp: 37.2 C 36.8 C  SpO2:  99%    Last Pain:  Vitals:   06/23/18 1525  TempSrc: Oral  PainSc: 0-No pain   Pain Goal: Patients Stated Pain Goal: 0 (06/23/18 0932)               Karleen Dolphin

## 2018-06-23 NOTE — Anesthesia Preprocedure Evaluation (Addendum)

## 2018-06-23 NOTE — MAU Note (Signed)
Pt C/O uc's since last night, have become very strong this a.m.  Denies bleeding or LOF.

## 2018-06-23 NOTE — Lactation Note (Signed)
This note was copied from a baby's chart. Lactation Consultation Note  Patient Name: Kirsten Esparza RUEAV'W Date: 06/23/2018 Reason for consult: Initial assessment   P5, Baby 4 hours old.  Mother breastfed her last two children - 3rd child 1 year, 4th child 6 mos. She states she has hand expressed drops and feels this baby is latching well.   Kandis Mannan recently breastfed for 20 min. Mother denies questions or concerns. Discussed basics. Mom encouraged to feed baby 8-12 times/24 hours and with feeding cues.  Mom made aware of O/P services, breastfeeding support groups, community resources, and our phone # for post-discharge questions.     Maternal Data Has patient been taught Hand Expression?: Yes Does the patient have breastfeeding experience prior to this delivery?: Yes  Feeding Feeding Type: Breast Fed  LATCH Score Latch: Grasps breast easily, tongue down, lips flanged, rhythmical sucking.  Audible Swallowing: A few with stimulation  Type of Nipple: Everted at rest and after stimulation  Comfort (Breast/Nipple): Soft / non-tender  Hold (Positioning): No assistance needed to correctly position infant at breast.  LATCH Score: 9  Interventions Interventions: Breast feeding basics reviewed  Lactation Tools Discussed/Used WIC Program: Yes   Consult Status Consult Status: Follow-up Date: 06/24/18 Follow-up type: In-patient    Dahlia Byes [facility] 06/23/2018, 4:10 PM

## 2018-06-23 NOTE — Progress Notes (Signed)
LABOR PROGRESS NOTE  Kirsten Esparza is a 27 y.o. [id number] at [redacted]w[redacted]d  admitted in SOL.   Subjective: Doing well, had epidural placed.   Objective: BP 110/78   Pulse (!) 111   Temp 98 F (36.7 C) (Oral)   Resp 16   Ht 5' (1.524 m)   Wt 73.1 kg   SpO2 100%   BMI 31.47 kg/m  or  Vitals:   06/23/18 1100 06/23/18 1105 06/23/18 1110 06/23/18 1115  BP: 115/67 111/83 111/71 110/78  Pulse: 74 76 77 (!) 111  Resp:    16  Temp:      TempSrc:      SpO2: 99% 100% 100%   Weight:      Height:         Dilation: 7.5 Effacement (%): 100 Cervical Position: Anterior Station: 0 Presentation: Vertex Exam by:: Lorn Junes, RN FHT: baseline rate 120's, moderate varibility, +acel, variable decels Toco: every few min   Labs: Lab Results  Component Value Date   WBC 10.0 06/23/2018   HGB 13.8 06/23/2018   HCT 40.0 06/23/2018   MCV 84.2 06/23/2018   PLT 182 06/23/2018    Patient Active Problem List   Diagnosis Date Noted  . Normal labor 06/23/2018    Assessment / Plan: 27 y.o. [id number] at [redacted]w[redacted]d here for SOL. GBS positive, Amp.   Labor: Expectant. AROM @ 1130, placed IUPC and Fetal electrode, start amnioinfusion. Likely deliver soon.  Fetal Wellbeing:  Cat 2 with variables- position changes and amnioinfusion, good return to baseline  Pain Control: Epidural  Anticipated MOD:  NSVD   Leticia Penna, D.O. Family Medicine PGY-1  06/23/2018, 11:55 AM

## 2018-06-23 NOTE — H&P (Signed)
Kirsten Esparza is a 27 y.o. female presenting for contractions which started last night.  Got stronger this morning.  [facility].  Prior deliveries were uncomplicated.  Patient Active Problem List   Diagnosis Date Noted  . Normal labor 06/23/2018   .  RN Note: Pt C/O uc's since last night, have become very strong this a.m.  Denies bleeding or LOF.  OB History    Gravida  4   Para  3   Term  3   Preterm      AB      Living  3     SAB      TAB      Ectopic      Multiple      Live Births             History reviewed. No pertinent past medical history. History reviewed. No pertinent surgical history. Family History: family history is not on file. Social History:  reports that she has never smoked. She has never used smokeless tobacco. She reports that she does not drink alcohol or use drugs.     Maternal Diabetes: No Genetic Screening: Normal Maternal Ultrasounds/Referrals: Normal Fetal Ultrasounds or other Referrals:  None Maternal Substance Abuse:  No Significant Maternal Medications:  None Significant Maternal Lab Results:  Lab values include: Group B Strep positive Other Comments:  None  Review of Systems  Constitutional: Negative for chills and fever.  Gastrointestinal: Positive for abdominal pain. Negative for nausea and vomiting.  Musculoskeletal: Positive for back pain.  Neurological: Negative for focal weakness and headaches.   Maternal Medical History:  Reason for admission: Contractions.  Nausea.  Contractions: Onset was 6-12 hours ago.   Frequency: regular.   Perceived severity is strong.    Fetal activity: Perceived fetal activity is normal.   Last perceived fetal movement was within the past hour.    Prenatal complications: No bleeding, PIH or pre-eclampsia.   Prenatal Complications - Diabetes: none.    Dilation: 7 Effacement (%): 100 Station: -1, 0 Exam by:: Lorn Junes, RN Blood pressure 110/78, pulse (!) 111, temperature 98 F (36.7 C), temperature source Oral, resp. rate 16, height 5' (1.524 m), weight 73.1 kg, SpO2 100 %. Maternal Exam:  Uterine Assessment: Contraction strength is firm.  Contraction frequency is regular.   Abdomen: Patient reports no abdominal tenderness. Estimated fetal weight is 6.5.   Fetal presentation: vertex  Introitus: Normal vulva. Normal vagina.  Ferning test: not done.  Nitrazine test: not done.  Pelvis: adequate for delivery.   Cervix: Cervix evaluated by digital exam.     Fetal Exam Fetal Monitor Review: Mode: ultrasound.   Baseline rate: 140.  Variability: moderate (6-25 bpm).   Pattern: accelerations present and no decelerations.    Fetal State Assessment: Category I - tracings are normal.     Physical Exam  Constitutional: She is oriented to person, place, and time. She appears well-developed and well-nourished. No distress.  HENT:  Head: Normocephalic.  Cardiovascular: Normal rate and regular rhythm.  Respiratory: Effort normal. No respiratory distress.  GI: Soft. She exhibits no distension. There is no tenderness. There is no rebound and no guarding.  Musculoskeletal: Normal range of motion.  Neurological: She is alert and oriented to person, place, and time.  Skin: Skin is warm and dry.  Psychiatric: She has a normal mood and affect.    Cervix 7/80/-2/vertex  Prenatal labs: ABO, Rh:  Antibody:   Rubella:   RPR:    HBsAg:    HIV:    GBS: Positive (05/28 0000)   Assessment/Plan: Single intrauterine pregnancy at [redacted]w[redacted]d Active labor  Admit to [facility] Routine orders Anticipate SVD    Wynelle Bourgeois 06/23/2018, 11:20 AM

## 2018-06-23 NOTE — Anesthesia Procedure Notes (Signed)
Epidural Patient location during procedure: OB Start time: 06/23/2018 10:39 AM End time: 06/23/2018 10:48 AM  Staffing Anesthesiologist: Lucretia Kern, MD Performed: anesthesiologist   Preanesthetic Checklist Completed: patient identified, pre-op evaluation, timeout performed, IV checked, risks and benefits discussed and monitors and equipment checked  Epidural Patient position: sitting Prep: DuraPrep Patient monitoring: heart rate, continuous pulse ox and blood pressure Approach: midline Location: L2-L3 Injection technique: LOR air  Needle:  Needle type: Tuohy  Needle gauge: 17 G Needle length: 9 cm Needle insertion depth: 5 cm Catheter type: closed end flexible Catheter size: 19 Gauge Catheter at skin depth: 10 cm  Assessment Events: blood not aspirated, injection not painful, no injection resistance, negative IV test and no paresthesia  Additional Notes Reason for block:procedure for pain

## 2018-06-24 LAB — RPR: RPR Ser Ql: NONREACTIVE

## 2018-06-24 LAB — KLEIHAUER-BETKE STAIN
# VIALS RHIG: 1
Fetal Cells %: 0 %
QUANTITATION FETAL HEMOGLOBIN: 0 mL

## 2018-06-24 MED ORDER — RHO D IMMUNE GLOBULIN 1500 UNIT/2ML IJ SOSY
300.0000 ug | PREFILLED_SYRINGE | Freq: Once | INTRAMUSCULAR | Status: AC
  Administered 2018-06-24: 300 ug via INTRAVENOUS
  Filled 2018-06-24: qty 2

## 2018-06-24 NOTE — Progress Notes (Signed)
CSW met with MOB via bedside to provide any supports needed. MOB was pleasant during conversation but seemed reserved. MOB stated she currently lives alone with her 3 other children. MOB states she recently moved to [place] from [place] due to family being in [place]. FOB is not involved at this time. MOB was open regarding having PPD with her previous pregnancy but did not go into too much detail. MOB states she feels like she is able to be open with her family and friends and believes her father is a big support to her. MOB states she is open to talking with her OBGYN in the event she does have feelings of anxiety/ depression but she is not very familiar with her OBGYN due to her recent move. MOB voiced no other concerns at this time.   CSW provided education regarding Baby Blues vs PMADs and provided MOB with resources for mental health follow up.  CSW encouraged MOB to evaluate her mental health throughout the postpartum period with the use of the New Mom Checklist developed by Postpartum [facility] as well as the Edinburgh Postnatal Depression Scale and notify a medical professional if symptoms arise.    Anacarolina Evelyn, LCSW Clinical Social Worker  [facility]  [phone]  

## 2018-06-24 NOTE — Progress Notes (Signed)
Post Partum Day 1 Subjective: no complaints, up ad lib, voiding, tolerating PO and + flatus  Objective: Blood pressure 106/68, pulse 82, temperature 100.2 F (37.9 C), temperature source Oral, resp. rate 16, height 5' (1.524 m), weight 73.1 kg, SpO2 100 %, unknown if currently breastfeeding.  Physical Exam:  General: alert, cooperative, appears stated age and no distress Lochia: appropriate Uterine Fundus: firm Incision:NA DVT Evaluation: No evidence of DVT seen on physical exam.  Recent Labs    06/23/18 1002  HGB 13.8  HCT 40.0    Assessment/Plan: Plan for discharge tomorrow, Breastfeeding, Lactation consult and Contraception to be determined   LOS: 1 day   Gwenevere Abbot 06/24/2018, 8:54 AM

## 2018-06-25 LAB — RH IG WORKUP (INCLUDES ABO/RH)
ABO/RH(D): O NEG
GESTATIONAL AGE(WKS): 38.6
Unit division: 0

## 2018-06-25 NOTE — Discharge Summary (Signed)
Postpartum Discharge Summary     Patient Name: Kirsten Esparza DOB: 31-Oct-1990 MRN: [id number]  Date of admission: 06/23/2018 Delivering Provider: Allayne Stack   Date of discharge: 06/25/2018  Admitting diagnosis: 39WKS CTX 1-3MINS Intrauterine pregnancy: [redacted]w[redacted]d     Secondary diagnosis:  Active Problems:   Normal labor  Additional problems: None     Discharge diagnosis: Term Pregnancy Delivered                                                                                                Post partum procedures:rhogam  Augmentation: none  Complications: None  Hospital course:  Onset of Labor With Vaginal Delivery     27 y.o. yo G5P5005 at [redacted]w[redacted]d was admitted in Active Labor on 06/23/2018. Patient had an uncomplicated labor course as follows:  Membrane Rupture Time/Date: 11:37 AM ,06/23/2018   Intrapartum Procedures: Episiotomy: None [1]                                         Lacerations:  1st degree [2];Perineal [11]  Patient had a delivery of a Viable infant. 06/23/2018  Information for the patient's newborn:  Julann, Mcgilvray [[id number]]  Delivery Method: Vaginal, Spontaneous(Filed from Delivery Summary)    Pateint had an uncomplicated postpartum course.  She is ambulating, tolerating a regular diet, passing flatus, and urinating well. Patient is discharged home in stable condition on 06/25/18.   Magnesium Sulfate recieved: No BMZ received: No  Physical exam  Vitals:   06/24/18 1008 06/24/18 1322 06/24/18 2137 06/25/18 0600  BP: 129/79 121/82 114/75 130/80  Pulse: 79 63 66 61  Resp: 18 16 17 16   Temp: 97.7 F (36.5 C) 98 F (36.7 C) 98.1 F (36.7 C) 97.6 F (36.4 C)  TempSrc: Oral Oral Oral Oral  SpO2: 100%  99%   Weight:      Height:       General: alert, cooperative and no distress Lochia: appropriate Uterine Fundus: firm Incision: N/A DVT Evaluation: No evidence of DVT seen on physical exam. Labs: Lab Results  Component Value Date   WBC  10.0 06/23/2018   HGB 13.8 06/23/2018   HCT 40.0 06/23/2018   MCV 84.2 06/23/2018   PLT 182 06/23/2018   CMP Latest Ref Rng & Units 11/24/2015  Glucose 65 - 99 mg/dL 87  BUN 6 - 20 mg/dL 9  Creatinine 7.84 - 6.96 mg/dL 2.95  Sodium 284 - 132 mmol/L 137  Potassium 3.5 - 5.1 mmol/L 3.8  Chloride 101 - 111 mmol/L 103  CO2 22 - 32 mmol/L 26  Calcium 8.9 - 10.3 mg/dL 9.0  Total Protein 6.5 - 8.1 g/dL 8.0  Total Bilirubin 0.3 - 1.2 mg/dL 0.3  Alkaline Phos 38 - 126 U/L 58  AST 15 - 41 U/L 22  ALT 14 - 54 U/L 20    Discharge instruction: per After Visit Summary and "Baby and Me Booklet".  After visit meds:  Allergies as of 06/25/2018   No Known  Allergies     Medication List    STOP taking these medications   ondansetron 8 MG disintegrating tablet Commonly known as:  ZOFRAN-ODT     TAKE these medications   aspirin-acetaminophen-caffeine 250-250-65 MG tablet Commonly known as:  EXCEDRIN MIGRAINE Take 1 tablet by mouth every 6 (six) hours as needed for headache.   prenatal multivitamin Tabs tablet Take 1 tablet by mouth daily at 12 noon.       Diet: routine diet  Activity: Advance as tolerated. Pelvic rest for 6 weeks.   Outpatient follow up:6 weeks Follow up Appt:No future appointments. Follow up Visit:   Please schedule this patient for Postpartum visit in: 6 weeks with the following provider: Any provider For C/S patients schedule nurse incision check in weeks 2 weeks: no Low risk pregnancy complicated by: none Delivery mode:  SVD Anticipated Birth Control:  other/unsure PP Procedures needed: none  Schedule Integrated BH visit: no      Newborn Data: Live born female  Birth Weight: 6 lb 8.8 oz (2970 g) APGAR: 9, 9  Newborn Delivery   Birth date/time:  06/23/2018 12:06:00 Delivery type:  Vaginal, Spontaneous     Baby Feeding: Breast Disposition:home with mother   06/25/2018 Gwenevere Abbot, MD

## 2018-06-25 NOTE — Lactation Note (Signed)
This note was copied from a baby's chart. Lactation Consultation Note  Patient Name: Kirsten Esparza [id number] Date: 06/25/2018 Reason for consult: Follow-up assessment;Nipple pain/trauma;Early term 57-38.6wks  Visited with [name] on day of discharge, baby at 4% weight loss at 61 hrs old.  Output good. Mom complaining of nipple soreness and some blistering on tip.  Baby latched in cradle hold without any support and Mom hunched over baby. Took baby off the breast, nipple rounded, slight blister on tip.  Sat Mom up more, added pillow support behind Mom.  Pillow placed under baby, un-swaddled baby to allow for STS at the breast.  Assisted Mom to use a U hold and hold baby's head securely and bring baby to her when he opens widely.  Baby attained a deep areolar grasp.  Regular sucking and swallowing identified.  Mom taught to do alternate breast compression to increase milk transfer. Mom denies pain with this latch. Encouraged STS and cue based feedings, goal of 8-12 feedings per 24 hrs. Engorgement prevention and treatment reviewed. Mom aware of OP lactation support available to her.  Encouraged to call prn.     LATCH Score Latch: Grasps breast easily, tongue down, lips flanged, rhythmical sucking.  Audible Swallowing: Spontaneous and intermittent  Type of Nipple: Everted at rest and after stimulation  Comfort (Breast/Nipple): Filling, red/small blisters or bruises, mild/mod discomfort  Hold (Positioning): Assistance needed to correctly position infant at breast and maintain latch.  LATCH Score: 8  Interventions Interventions: Breast feeding basics reviewed;Assisted with latch;Skin to skin;Breast massage;Hand express;Breast compression;Adjust position;Support pillows;Position options;Coconut oil;Comfort gels  Lactation Tools Discussed/Used Tools: Coconut oil;Comfort gels   Consult Status Consult Status: Complete Date: 06/25/18 Follow-up type: Call as needed    [name] 06/25/2018, 8:44 AM

## 2018-09-17 ENCOUNTER — Ambulatory Visit (INDEPENDENT_AMBULATORY_CARE_PROVIDER_SITE_OTHER): Payer: Medicaid Other | Admitting: Family Medicine

## 2018-09-17 ENCOUNTER — Encounter: Payer: Self-pay | Admitting: Family Medicine

## 2018-09-17 VITALS — BP 120/70 | HR 71 | Temp 98.2°F | Wt 154.0 lb

## 2018-09-17 DIAGNOSIS — Z0000 Encounter for general adult medical examination without abnormal findings: Secondary | ICD-10-CM | POA: Diagnosis not present

## 2018-09-17 DIAGNOSIS — M654 Radial styloid tenosynovitis [de Quervain]: Secondary | ICD-10-CM | POA: Diagnosis not present

## 2018-09-17 DIAGNOSIS — Z7689 Persons encountering health services in other specified circumstances: Secondary | ICD-10-CM

## 2018-09-17 NOTE — Patient Instructions (Signed)
It was great to see you again today.  Everything looks good.  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  It can occasionally cause that stuck feeling that your thumb gets.  The initial treatment is to use over the counter NSAIDs like ibuprofen.  You can also put ice on it when you are at home.  The next step if that didn't work would be to get an immobilizing cast on your wrist and wear it during the day.  If none of this helps we can eventually consider treating it with an injection.    If this is still bothering you in 6 weeks you can make another appointment with us.    Have a great day,   Frederic Jerichoan Karee Forge, MD.   Suzette Battieste Quervain's Tenosynovitis  Tommi Rumpse Quervain's tenosynovitis is a condition that causes inflammation of the tendon on the thumb side of the wrist. Tendons are cords of tissue that connect bones to muscles. The tendons in the hand pass through a tunnel called a sheath. A slippery layer of tissue (synovium) lets the tendons move smoothly in the sheath. With de Quervain's tenosynovitis, the sheath swells or thickens, causing friction and pain. The condition is also called de Quervain's disease and de Quervain's syndrome. It occurs most often in women who are 6030-28 years old. What are the causes? The exact cause of this condition is not known. It may be associated with overuse of the hand and wrist. What increases the risk? You are more likely to develop this condition if you:  Use your hands far more than normal, especially if you repeat certain movements that involve twisting your hand or using a tight grip.  Are pregnant.  Are a middle-aged woman.  Have rheumatoid arthritis.  Have diabetes. What are the signs or symptoms? The main symptom of this condition is pain on the thumb side of the wrist. The pain may get worse when you grasp something or turn your wrist. Other symptoms may include:  Pain that extends up the forearm.  Swelling of your wrist and  hand.  Trouble moving the thumb and wrist.  A sensation of snapping in the wrist.  A bump filled with fluid (cyst) in the area of the pain. How is this diagnosed? This condition may be diagnosed based on:  Your symptoms and medical history.  A physical exam. During the exam, your health care provider may do a simple test Lourena Simmonds(Finkelstein test) that involves pulling your thumb and wrist to see if this causes pain. You may also need to have an X-ray. How is this treated? Treatment for this condition may include:  Avoiding any activity that causes pain and swelling.  Taking medicines. Anti-inflammatory medicines and corticosteroid injections may be used to reduce inflammation and relieve pain.  Wearing a splint.  Having surgery. This may be needed if other treatments do not work. Once the pain and swelling has gone down:  Physical therapy. This includes stretching and strengthening exercises.  Occupational therapy. This includes adjusting how you move your wrist. Follow these instructions at home: If you have a splint:  Wear the splint as told by your health care provider. Remove it only as told by your health care provider.  Loosen the splint if your fingers tingle, become numb, or turn cold and blue.  Keep the splint clean.  If the splint is not waterproof: ? Do not let it get wet. ? Cover it with a watertight covering when you take a  bath or a shower. Managing pain, stiffness, and swelling   Avoid movements and activities that cause pain and swelling in the wrist area.  If directed, put ice on the painful area. This may be helpful after doing activities that involve the sore wrist. ? Put ice in a plastic bag. ? Place a towel between your skin and the bag. ? Leave the ice on for 20 minutes, 2-3 times a day.  Move your fingers often to avoid stiffness and to lessen swelling.  Raise (elevate) the injured area above the level of your heart while you are sitting or lying  down. General instructions  Return to your normal activities as told by your health care provider. Ask your health care provider what activities are safe for you.  Take over-the-counter and prescription medicines only as told by your health care provider.  Keep all follow-up visits as told by your health care provider. This is important. Contact a health care provider if:  Your pain medicine does not help.  Your pain gets worse.  You develop new symptoms. Summary  De Quervain's tenosynovitis is a condition that causes inflammation of the tendon on the thumb side of the wrist.  The condition occurs most often in women who are 630-28 years old.  The exact cause of this condition is not known. It may be associated with overuse of the hand and wrist.  Treatment starts with avoiding activity that causes pain or swelling in the wrist area. Other treatment may include wearing a splint and taking medicine. Sometimes, surgery is needed. This information is not intended to replace advice given to you by your health care provider. Make sure you discuss any questions you have with your health care provider. Document Released: 05/15/2001 Document Revised: 02/20/2018 Document Reviewed: 07/29/2017 [employer]vier Interactive Patient Education  2019 [employer]

## 2018-09-17 NOTE — Progress Notes (Signed)
Subjective:   Chief Complaint  Patient presents with  . Establish Care  . Temporomandibular Joint Pain    right wrist   HPI Kirsten Esparza is a [age] y.o. old female here  for annual exam.  Concern today: none.   Patient works at [employer]. [place] decided to stay in [place].  Father of baby is staying in [place].  All five of them (patient and 4 children) live together.  Patient's Father and father's family lives here.    Changes in his/her health in the last 12 months: yes.  She has had wrist and joint pain in right hand since her last child was born.  Mainly her wrist hurts.  Constant ache all day.  Thumb gets stuck in flexed position sometimes.  No major PMH, PSH.   Hasn't started birth control since omar was born in october.  Hasn't menstruated during this time either..       Occupation:  Physicist, [employer].  Wears seatbelt: yes.    The patient has regular exercise: no.   Enough vegetables and fruits: eats fish/chicken.  Eats fruits and veggies. No fried foods.  .  Smokes cigarette: no Drinks EtOH: no. Used to drink wine before she was pregnant.   Drug use: no Patient takes ASA: no.  Patient takes vitD & Ca: no. Ever been transfused or tattooed?: yes. 4 tattoos.  Last time was when she was 17.   The patient is not currently sexually active.  Patient uses birth control: no.  Domestic violence: no.  Advance directive: not applicable. MOST: not applicable.   History of depression:no. Had PPD after amir was born.   Patient dental home: no.  Screening Need colon cancer screening: no. Need breast cancer ccreening: no. Need cervical cancer Screening: last had a pap smear when she was pregnant.  Marland Kitchen. STOP BANG >/=3 for OSA: not applicable. Need lung cancer screening (men > 55):no. Need AAA screening (men 65-74, >100 cigarettes):no At risk for skin cancer: no. Need HCV Screening: no. Need STI Screening: yes. Fall in the last 12  months:no  PMH/Problem List: has Normal labor and De Quervain's tenosynovitis on their problem list.   has no past medical history on file.  [facility]  Family History  Problem Relation Age of Onset  . Diabetes Sister    Family history of heart disease before age of 28 yrs: no. Family history of stroke: no. Family history of cancer: no.  SH Social History   Tobacco Use  . Smoking status: Never Smoker  . Smokeless tobacco: Never Used  Substance Use Topics  . Alcohol use: No  . Drug use: No     Review of Systems  Constitutional: Negative for fever.  HENT: Negative for hearing loss, rhinorrhea and sore throat.   Eyes: Negative for visual disturbance.  Respiratory: Negative for cough and shortness of breath.   Cardiovascular: Negative for chest pain.  Gastrointestinal: Negative for abdominal pain, constipation, diarrhea, nausea and vomiting.  Endocrine: Negative for heat intolerance and polyuria.  Genitourinary: Negative for dysuria.  Skin: Negative for rash.  Neurological: Negative for dizziness and headaches.  Psychiatric/Behavioral: Negative for dysphoric mood.        Objective:   Physical Exam Vitals:   09/17/18 1651  BP: 120/70  Pulse: 71  Temp: 98.2 F (36.8 C)  TempSrc: Oral  SpO2: 99%  Weight: 154 lb (69.9 kg)   Body mass index is 30.08 kg/m.  GEN: appears well & comfortable. No apparent distress.  Head: normocephalic and atraumatic  Eyes: conjunctiva without injection. Sclera anicteric. Ears: external ear, ear canal and TM normal Nares: no rhinorrhea.  Oropharynx: MMM. No erythema. No exudation or petechiae.  Uvula midline HEM: negative for cervical or periauricular lymphadenopathies CVS: RRR, nl s1 & s2, no murmurs, no edema,  2+ radial pulses bilaterally,  RESP: no IWOB, good air movement bilaterally, CTAB GI: BS present & normal, soft, NTND, no guarding, no rebound, no palpable mass GU: no suprapubic tenderness Extremities: phalen and tinel test  negative. Finkelstein's test positive on right side. No mass SKIN: significant abdominal striae.  NEURO: alert and oiented appropriately, no gross cranial nerve deficits   PSYCH: euthymic mood with congruent affect     Assessment & Plan:  There are no diagnoses linked to this encounter.   1. De Quervain's tenosynovitis - has had right wrist pain since her son was born in October - states sometimes her thumb gets 'locked' in flexed position - negative for carpal tunnel on physical exam.  Finkelstein's test positive - likely de quervain's tenosynovitis.   - recommend ice and nsaid's.  Hard splint OTC if symptoms don't improve.   2. Establishing care with new doctor, encounter for - patient healthy with no reported previous medical conditions.  No medications.  - no labs indicated at this time.    Frederic Jericho PGY-1 09/18/18  3:37 PM

## 2018-09-18 DIAGNOSIS — M654 Radial styloid tenosynovitis [de Quervain]: Secondary | ICD-10-CM | POA: Insufficient documentation

## 2018-09-18 NOTE — Assessment & Plan Note (Signed)
-   has had right wrist pain since her son was born in October - states sometimes her thumb gets 'locked' in flexed position - negative for carpal tunnel on physical exam.  Finkelstein's test positive - likely de quervain's tenosynovitis.   - recommend ice and nsaid's.  Hard splint OTC if symptoms don't improve.
